# Patient Record
Sex: Female | Born: 1995 | Race: Black or African American | Hispanic: No | Marital: Single | Smoking: Never smoker
Health system: Southern US, Community
[De-identification: ages and names within clinical notes are randomized; demographics above are authoritative.]

## PROBLEM LIST (undated history)

## (undated) DIAGNOSIS — N289 Disorder of kidney and ureter, unspecified: Secondary | ICD-10-CM

## (undated) DIAGNOSIS — I10 Essential (primary) hypertension: Secondary | ICD-10-CM

## (undated) DIAGNOSIS — IMO0002 Reserved for concepts with insufficient information to code with codable children: Secondary | ICD-10-CM

## (undated) DIAGNOSIS — M329 Systemic lupus erythematosus, unspecified: Secondary | ICD-10-CM

---
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
## 2017-05-29 DIAGNOSIS — I10 Essential (primary) hypertension: Secondary | ICD-10-CM | POA: Insufficient documentation

## 2017-05-29 DIAGNOSIS — N1339 Other hydronephrosis: Secondary | ICD-10-CM | POA: Diagnosis not present

## 2017-05-29 DIAGNOSIS — Z79899 Other long term (current) drug therapy: Secondary | ICD-10-CM | POA: Insufficient documentation

## 2017-05-29 DIAGNOSIS — N3000 Acute cystitis without hematuria: Secondary | ICD-10-CM | POA: Insufficient documentation

## 2017-05-29 DIAGNOSIS — R1032 Left lower quadrant pain: Secondary | ICD-10-CM | POA: Diagnosis present

## 2017-05-30 ENCOUNTER — Emergency Department (HOSPITAL_COMMUNITY)
Admission: EM | Admit: 2017-05-30 | Discharge: 2017-05-30 | Disposition: A | Discharge status: Home / Self Care | Payer: Medicaid Other | Attending: Emergency Medicine | Admitting: Emergency Medicine

## 2017-05-30 ENCOUNTER — Emergency Department (HOSPITAL_COMMUNITY): Payer: Medicaid Other

## 2017-05-30 ENCOUNTER — Encounter (HOSPITAL_COMMUNITY): Payer: Self-pay

## 2017-05-30 DIAGNOSIS — R109 Unspecified abdominal pain: Secondary | ICD-10-CM

## 2017-05-30 DIAGNOSIS — N3000 Acute cystitis without hematuria: Secondary | ICD-10-CM

## 2017-05-30 DIAGNOSIS — N1339 Other hydronephrosis: Secondary | ICD-10-CM

## 2017-05-30 HISTORY — DX: Disorder of kidney and ureter, unspecified: N28.9

## 2017-05-30 LAB — CBC WITH DIFFERENTIAL/PLATELET
BASOS PCT: 0 %
Basophils Absolute: 0 10*3/uL (ref 0.0–0.1)
EOS PCT: 0 %
Eosinophils Absolute: 0 10*3/uL (ref 0.0–0.7)
HEMATOCRIT: 40.7 % (ref 36.0–46.0)
Hemoglobin: 13.2 g/dL (ref 12.0–15.0)
Lymphocytes Relative: 19 %
Lymphs Abs: 1.3 10*3/uL (ref 0.7–4.0)
MCH: 24.1 pg — ABNORMAL LOW (ref 26.0–34.0)
MCHC: 32.4 g/dL (ref 30.0–36.0)
MCV: 74.4 fL — AB (ref 78.0–100.0)
MONO ABS: 0.6 10*3/uL (ref 0.1–1.0)
Monocytes Relative: 9 %
NEUTROS ABS: 5 10*3/uL (ref 1.7–7.7)
Neutrophils Relative %: 72 %
Platelets: 435 10*3/uL — ABNORMAL HIGH (ref 150–400)
RBC: 5.47 MIL/uL — ABNORMAL HIGH (ref 3.87–5.11)
RDW: 18.6 % — AB (ref 11.5–15.5)
WBC: 6.9 10*3/uL (ref 4.0–10.5)

## 2017-05-30 LAB — URINALYSIS, ROUTINE W REFLEX MICROSCOPIC
BILIRUBIN URINE: NEGATIVE
Glucose, UA: NEGATIVE mg/dL
HGB URINE DIPSTICK: NEGATIVE
KETONES UR: NEGATIVE mg/dL
NITRITE: NEGATIVE
Specific Gravity, Urine: 1.024 (ref 1.005–1.030)
pH: 7 (ref 5.0–8.0)

## 2017-05-30 LAB — BASIC METABOLIC PANEL
Anion gap: 8 (ref 5–15)
BUN: 9 mg/dL (ref 6–20)
CALCIUM: 8.5 mg/dL — AB (ref 8.9–10.3)
CO2: 23 mmol/L (ref 22–32)
CREATININE: 0.73 mg/dL (ref 0.44–1.00)
Chloride: 106 mmol/L (ref 101–111)
GFR calc non Af Amer: >60 mL/min (ref >60)
GLUCOSE: 128 mg/dL — AB (ref 65–99)
Potassium: 3.5 mmol/L (ref 3.5–5.1)
Sodium: 137 mmol/L (ref 135–145)

## 2017-05-30 LAB — POC URINE PREG, ED: PREG TEST UR: NEGATIVE

## 2017-05-30 MED ORDER — CEPHALEXIN 500 MG PO CAPS: 500.0000 mg | ORAL_CAPSULE | Freq: Three times a day (TID) | ORAL | 0 refills | Status: DC

## 2017-05-30 MED ORDER — DEXTROSE 5 % IV SOLN
1.0000 g | Freq: Once | INTRAVENOUS | Status: AC
  Administered 2017-05-30: 1 g via INTRAVENOUS
  Filled 2017-05-30: qty 10

## 2017-05-30 MED ORDER — SODIUM CHLORIDE 0.9 % IV BOLUS (SEPSIS)
500.0000 mL | Freq: Once | INTRAVENOUS | Status: AC
  Administered 2017-05-30: 500 mL via INTRAVENOUS

## 2017-05-30 MED ORDER — OXYCODONE-ACETAMINOPHEN 5-325 MG PO TABS: 1.0000 | ORAL_TABLET | Freq: Four times a day (QID) | ORAL | 0 refills | Status: DC | PRN

## 2017-05-30 MED ORDER — ONDANSETRON HCL 4 MG/2ML IJ SOLN
4.0000 mg | Freq: Once | INTRAMUSCULAR | Status: AC
  Administered 2017-05-30: 4 mg via INTRAVENOUS
  Filled 2017-05-30: qty 2

## 2017-05-30 MED ORDER — MORPHINE SULFATE (PF) 4 MG/ML IV SOLN
4.0000 mg | Freq: Once | INTRAVENOUS | Status: AC
  Administered 2017-05-30: 4 mg via INTRAVENOUS
  Filled 2017-05-30: qty 1

## 2017-05-30 NOTE — ED Provider Notes (Signed)
[facility]   CSN: [id number] Arrival date & time: 05/29/17  2251     History   Chief Complaint Chief Complaint  Patient presents with  . Flank Pain    Left    HPI Traci Ward is a 22 y.o. female.  HPI   This is a 22 year old female with history of hypertension, kidney disease, lupus who presents with left leg pain.  Onset of symptoms on Thursday.  She reports 7 out of 10 left flank pain that radiates towards her abdomen and umbilicus.  Pain comes and goes.  Does not seem to be worsened by anything.  Denies dysuria or hematuria.  Denies fevers.  Patient is concerned it may be related to her kidneys.  She has not taken anything for her pain.  She is chronically immunosuppressed on Plaquenil.  She does report several episodes of nonbilious, nonbloody emesis.  No diarrhea.  Past Medical History:  Diagnosis Date  . Hypertension   . Kidney disease   . Lupus     There are no active problems to display for this patient.   History reviewed. No pertinent surgical history.  OB History    No data available       Home Medications    Prior to Admission medications   Medication Sig Start Date End Date Taking? Authorizing Provider  amLODipine (NORVASC) 5 MG tablet Take 10 mg by mouth 2 (two) times daily.  01/13/16  Yes [provider]  cetirizine (ZYRTEC) 10 MG tablet Take 10 mg by mouth daily. 12/15/15  Yes [provider]  Cholecalciferol (VITAMIN D) 2000 units CAPS Take 2,000 Units by mouth daily.   Yes [provider]  enalapril (VASOTEC) 5 MG tablet Take 5 mg by mouth daily.   Yes [provider]  hydrochlorothiazide (MICROZIDE) 12.5 MG capsule Take 12.5 mg by mouth 2 (two) times daily.  01/09/16  Yes [provider]  hydroxychloroquine (PLAQUENIL) 200 MG tablet Take 200 mg by mouth 2 (two) times daily.   Yes [provider]  ibuprofen (ADVIL,MOTRIN) 200 MG tablet Take 400 mg  by mouth every 6 (six) hours as needed for headache, mild pain or moderate pain.    Yes [provider]  mycophenolate (CELLCEPT) 250 MG capsule Take 1,000 mg by mouth 2 (two) times daily.  11/04/15  Yes [provider]  ondansetron (ZOFRAN) 8 MG tablet Take 8 mg by mouth every 6 (six) hours as needed for nausea or vomiting.  12/27/15  Yes [provider]  cephALEXin (KEFLEX) 500 MG capsule Take 1 capsule (500 mg total) by mouth 3 (three) times daily. 05/30/17   Horton, Mayer Masker, MD  oxyCODONE-acetaminophen (PERCOCET/ROXICET) 5-325 MG tablet Take 1 tablet by mouth every 6 (six) hours as needed for severe pain. 05/30/17   Horton, Mayer Masker, MD    Family History History reviewed. No pertinent family history.  Social History Social History   Tobacco Use  . Smoking status: Never Smoker  . Smokeless tobacco: Never Used  Substance Use Topics  . Alcohol use: No  . Drug use: No     Allergies   Patient has no known allergies.   Review of Systems Review of Systems  Constitutional: Negative for fever.  Respiratory: Negative for shortness of breath.   Cardiovascular: Negative for chest pain.  Gastrointestinal: Positive for nausea and vomiting. Negative for abdominal pain and diarrhea.  Genitourinary: Positive for flank pain. Negative for dysuria and hematuria.  All  other systems reviewed and are negative.    Physical Exam Updated Vital Signs BP (!) 173/129 (BP Location: Left Arm)   Pulse 80   Temp 98.2 F (36.8 C) (Oral)   Resp 16   SpO2 99%   Physical Exam  Constitutional: She is oriented to person, place, and time. She appears well-developed and well-nourished.  Obese  HENT:  Head: Normocephalic and atraumatic.  Cardiovascular: Normal rate, regular rhythm and normal heart sounds.  Pulmonary/Chest: Effort normal and breath sounds normal. No respiratory distress. She has no wheezes.  Abdominal: Soft. Bowel sounds are normal. She exhibits no mass.  There is tenderness.  Genitourinary:  Genitourinary Comments: Left CVA tenderness  Neurological: She is alert and oriented to person, place, and time.  Skin: Skin is warm and dry.  Psychiatric: She has a normal mood and affect.  Nursing note and vitals reviewed.    ED Treatments / Results  Labs (all labs ordered are listed, but only abnormal results are displayed) Labs Reviewed  URINALYSIS, ROUTINE W REFLEX MICROSCOPIC - Abnormal; Notable for the following components:      Result Value   APPearance HAZY (*)    Protein, ur >=300 (*)    Leukocytes, UA TRACE (*)    Bacteria, UA RARE (*)    Squamous Epithelial / LPF 0-5 (*)    All other components within normal limits  CBC WITH DIFFERENTIAL/PLATELET - Abnormal; Notable for the following components:   RBC 5.47 (*)    MCV 74.4 (*)    MCH 24.1 (*)    RDW 18.6 (*)    Platelets 435 (*)    All other components within normal limits  BASIC METABOLIC PANEL - Abnormal; Notable for the following components:   Glucose, Bld 128 (*)    Calcium 8.5 (*)    All other components within normal limits  URINE CULTURE  POC URINE PREG, ED    EKG  EKG Interpretation None       Radiology Ct Renal Stone Study  Result Date: 05/30/2017 CLINICAL DATA:  Left flank pain for 6 days. No microhematuria. Negative urine pregnancy test. History of hypertension, lupus, and kidney disease. EXAM: CT ABDOMEN AND PELVIS WITHOUT CONTRAST TECHNIQUE: Multidetector CT imaging of the abdomen and pelvis was performed following the standard protocol without IV contrast. COMPARISON:  None. FINDINGS: Lower chest: Lung bases are clear. Hepatobiliary: No focal liver abnormality is seen. No gallstones, gallbladder wall thickening, or biliary dilatation. Pancreas: Unremarkable. No pancreatic ductal dilatation or surrounding inflammatory changes. Spleen: Normal in size without focal abnormality. Adrenals/Urinary Tract: No adrenal gland nodules. Kidneys are symmetrical in size.  Mild left hydronephrosis and hydroureter with stranding around the left kidney. No radiopaque stones are demonstrated. This could represent obstruction due to a stricture or non radiopaque stone or it could be sequela of recently passed stone. Pyelonephritis with reflux would also be a possibility. Bladder wall is not thickened and no bladder stones are demonstrated. Stomach/Bowel: Stomach is within normal limits. Appendix appears normal. No evidence of bowel wall thickening, distention, or inflammatory changes. Vascular/Lymphatic: No significant vascular findings are present. Prominent lymph nodes in the retroperitoneum likely to represent reactive nodes. Reproductive: Uterus and bilateral adnexa are unremarkable. Other: No abdominal wall hernia or abnormality. No abdominopelvic ascites. Musculoskeletal: No acute or significant osseous findings. IMPRESSION: 1. Left hydronephrosis and hydroureter with stranding around the left ureter. No stones identified. This could represent obstruction due to a stricture or non radiopaque stone, sequela of recently passed stone, or  pyelonephritis with reflux. 2. Moderately prominent lymph nodes in the retroperitoneum are likely reactive. Electronically Signed   By: Burman Nieves M.D.   On: 05/30/2017 05:04    Procedures Procedures (including critical care time)  Medications Ordered in ED Medications  cefTRIAXone (ROCEPHIN) 1 g in dextrose 5 % 50 mL IVPB (not administered)  morphine 4 MG/ML injection 4 mg (4 mg Intravenous Given 05/30/17 0505)  ondansetron (ZOFRAN) injection 4 mg (4 mg Intravenous Given 05/30/17 0505)  sodium chloride 0.9 % bolus 500 mL (500 mLs Intravenous New Bag/Given 05/30/17 0505)     Initial Impression / Assessment and Plan / ED Course  I have reviewed the triage vital signs and the nursing notes.  Pertinent labs & imaging results that were available during my care of the patient were reviewed by me and considered in my medical decision  making (see chart for details).     Patient presents with left flank pain.  She is chronically immunosuppressed on Plaquenil.  She is overall nontoxic appearing.  She is hypertensive.  History of the same.  She does have some CVA tenderness.  However she denies any dysuria or fevers.  Lab work is largely reassuring.  No leukocytosis.  Urinalysis does have too numerous to count white cells and rare bacteria.  Urine culture was sent.  CT renal stone study obtained.  There is some left-sided hydronephrosis and stranding which could reflect infection versus recently passed stone versus non-radiopaque stone.  On recheck, patient states that she feels somewhat better.  She was given a dose of IV Rocephin.  Will treat with Keflex for pyelonephritis and provide urology follow-up.  Patient does not appear septic at this time.  She was given strict return precautions.  After history, exam, and medical workup I feel the patient has been appropriately medically screened and is safe for discharge home. Pertinent diagnoses were discussed with the patient. Patient was given return precautions.   Final Clinical Impressions(s) / ED Diagnoses   Final diagnoses:  Left flank pain  Acute cystitis without hematuria  Other hydronephrosis    ED Discharge Orders        Ordered    cephALEXin (KEFLEX) 500 MG capsule  3 times daily     05/30/17 0634    oxyCODONE-acetaminophen (PERCOCET/ROXICET) 5-325 MG tablet  Every 6 hours PRN     05/30/17 0634       Shon Baton, MD 05/30/17 [phone]

## 2017-05-30 NOTE — Discharge Instructions (Signed)
You were seen today for left flank pain.  Your urine may be infected.  Urine culture is pending.  You will be treated for urinary tract infection.  Scan shows evidence of dilation of your left ureter and renal pelvis.  This can reflect a recently passed stone, infection, or a stone that cannot be seen on CT scan.  Take the pain medication as needed.  Follow-up with urology.  If you develop any new or worsening symptoms you should be reevaluated.

## 2017-05-30 NOTE — ED Triage Notes (Signed)
Pt reports 9/10 left sided flank pain since Thursday. Pt denies fevers at home. Pt reports hx of kidney disease and HTN. Pt denies dysuria. Pt denies vaginal d/c. Pt A+OX4, speaking in complete sentences.

## 2017-05-31 LAB — URINE CULTURE

## 2017-08-04 ENCOUNTER — Emergency Department (HOSPITAL_COMMUNITY): Payer: Medicaid Other

## 2017-08-04 ENCOUNTER — Emergency Department (HOSPITAL_COMMUNITY)
Admission: EM | Admit: 2017-08-04 | Discharge: 2017-08-04 | Disposition: A | Discharge status: Home / Self Care | Payer: Medicaid Other | Attending: Physician Assistant | Admitting: Physician Assistant

## 2017-08-04 ENCOUNTER — Other Ambulatory Visit: Payer: Self-pay

## 2017-08-04 DIAGNOSIS — M329 Systemic lupus erythematosus, unspecified: Secondary | ICD-10-CM | POA: Diagnosis not present

## 2017-08-04 DIAGNOSIS — I129 Hypertensive chronic kidney disease with stage 1 through stage 4 chronic kidney disease, or unspecified chronic kidney disease: Secondary | ICD-10-CM | POA: Insufficient documentation

## 2017-08-04 DIAGNOSIS — R112 Nausea with vomiting, unspecified: Secondary | ICD-10-CM | POA: Insufficient documentation

## 2017-08-04 DIAGNOSIS — R109 Unspecified abdominal pain: Secondary | ICD-10-CM | POA: Diagnosis present

## 2017-08-04 DIAGNOSIS — N189 Chronic kidney disease, unspecified: Secondary | ICD-10-CM | POA: Diagnosis not present

## 2017-08-04 DIAGNOSIS — Z8739 Personal history of other diseases of the musculoskeletal system and connective tissue: Secondary | ICD-10-CM | POA: Diagnosis not present

## 2017-08-04 DIAGNOSIS — R000 Tachycardia, unspecified: Secondary | ICD-10-CM | POA: Insufficient documentation

## 2017-08-04 DIAGNOSIS — Z79899 Other long term (current) drug therapy: Secondary | ICD-10-CM | POA: Insufficient documentation

## 2017-08-04 LAB — URINALYSIS, ROUTINE W REFLEX MICROSCOPIC
BILIRUBIN URINE: NEGATIVE
Glucose, UA: NEGATIVE mg/dL
Ketones, ur: 5 mg/dL — AB
Nitrite: NEGATIVE
PH: 7 (ref 5.0–8.0)
Protein, ur: >=300 mg/dL — AB
SPECIFIC GRAVITY, URINE: 1.008 (ref 1.005–1.030)

## 2017-08-04 LAB — CBC
HCT: 43.2 % (ref 36.0–46.0)
HEMOGLOBIN: 14 g/dL (ref 12.0–15.0)
MCH: 24.5 pg — AB (ref 26.0–34.0)
MCHC: 32.4 g/dL (ref 30.0–36.0)
MCV: 75.7 fL — AB (ref 78.0–100.0)
Platelets: 441 10*3/uL — ABNORMAL HIGH (ref 150–400)
RBC: 5.71 MIL/uL — AB (ref 3.87–5.11)
RDW: 17.4 % — ABNORMAL HIGH (ref 11.5–15.5)
WBC: 7.9 10*3/uL (ref 4.0–10.5)

## 2017-08-04 LAB — I-STAT TROPONIN, ED: Troponin i, poc: 0 ng/mL (ref 0.00–0.08)

## 2017-08-04 LAB — BASIC METABOLIC PANEL
Anion gap: 11 (ref 5–15)
BUN: 7 mg/dL (ref 6–20)
CHLORIDE: 105 mmol/L (ref 101–111)
CO2: 21 mmol/L — ABNORMAL LOW (ref 22–32)
Calcium: 8.8 mg/dL — ABNORMAL LOW (ref 8.9–10.3)
Creatinine, Ser: 0.77 mg/dL (ref 0.44–1.00)
GFR calc Af Amer: >60 mL/min (ref >60)
GFR calc non Af Amer: >60 mL/min (ref >60)
GLUCOSE: 131 mg/dL — AB (ref 65–99)
POTASSIUM: 4 mmol/L (ref 3.5–5.1)
Sodium: 137 mmol/L (ref 135–145)

## 2017-08-04 LAB — PREGNANCY, URINE: Preg Test, Ur: NEGATIVE

## 2017-08-04 MED ORDER — CEPHALEXIN 500 MG PO CAPS: 500.0000 mg | ORAL_CAPSULE | Freq: Four times a day (QID) | ORAL | 0 refills | Status: DC

## 2017-08-04 MED ORDER — KETOROLAC TROMETHAMINE 30 MG/ML IJ SOLN
30.0000 mg | Freq: Once | INTRAMUSCULAR | Status: AC
  Administered 2017-08-04: 30 mg via INTRAVENOUS
  Filled 2017-08-04: qty 1

## 2017-08-04 MED ORDER — SODIUM CHLORIDE 0.9 % IV SOLN
1.0000 g | Freq: Once | INTRAVENOUS | Status: AC
  Administered 2017-08-04: 1 g via INTRAVENOUS
  Filled 2017-08-04: qty 10

## 2017-08-04 MED ORDER — ONDANSETRON HCL 4 MG/2ML IJ SOLN
4.0000 mg | Freq: Once | INTRAMUSCULAR | Status: AC
  Administered 2017-08-04: 4 mg via INTRAVENOUS
  Filled 2017-08-04: qty 2

## 2017-08-04 MED ORDER — SODIUM CHLORIDE 0.9 % IV BOLUS
1000.0000 mL | Freq: Once | INTRAVENOUS | Status: AC
  Administered 2017-08-04: 1000 mL via INTRAVENOUS

## 2017-08-04 MED ORDER — OXYCODONE-ACETAMINOPHEN 5-325 MG PO TABS: 1.0000 | ORAL_TABLET | Freq: Four times a day (QID) | ORAL | 0 refills | Status: DC | PRN

## 2017-08-04 MED ORDER — ONDANSETRON 4 MG PO TBDP: 4.0000 mg | ORAL_TABLET | Freq: Three times a day (TID) | ORAL | 0 refills | Status: DC | PRN

## 2017-08-04 NOTE — ED Provider Notes (Signed)
[facility]-EMERGENCY DEPT Provider Note   CSN: [id number] Arrival date & time: 08/04/17  [id number]     History   Chief Complaint Chief Complaint  Patient presents with  . Flank Pain    Left    HPI Traci Ward is a 22 y.o. female.  HPI   22 year old female with past medical history significant for hypertension and kidney disease and lupus.  Presenting today with left-sided CVA pain.  Patient reports going on since last Sunday.  It is intermittent and yet present all the time.  This worsens and gets better.  Patient reports she has a lot of urgency but no burning with urination.  Patient reports his has one time previously and they told her "you might have kidney stones".  Patient never had any surgeries previously.  No fevers at home.  She does report nausea and vomiting.  Past Medical History:  Diagnosis Date  . Hypertension   . Kidney disease   . Lupus     There are no active problems to display for this patient.   No past surgical history on file.   OB History   None      Home Medications    Prior to Admission medications   Medication Sig Start Date End Date Taking? Authorizing Provider  amLODipine (NORVASC) 5 MG tablet Take 10 mg by mouth 2 (two) times daily.  01/13/16   [provider]  cephALEXin (KEFLEX) 500 MG capsule Take 1 capsule (500 mg total) by mouth 3 (three) times daily. 05/30/17   Horton, Mayer Maskerourtney F, MD  cetirizine (ZYRTEC) 10 MG tablet Take 10 mg by mouth daily. 12/15/15   [provider]  Cholecalciferol (VITAMIN D) 2000 units CAPS Take 2,000 Units by mouth daily.    [provider]  enalapril (VASOTEC) 5 MG tablet Take 5 mg by mouth daily.    [provider]  hydrochlorothiazide (MICROZIDE) 12.5 MG capsule Take 12.5 mg by mouth 2 (two) times daily.  01/09/16   [provider]  hydroxychloroquine (PLAQUENIL) 200 MG tablet Take 200 mg by mouth 2 (two) times daily.    [provider]  ibuprofen (ADVIL,MOTRIN) 200 MG tablet Take 400 mg by mouth every 6 (six) hours as needed for headache, mild pain or moderate pain.     [provider]  mycophenolate (CELLCEPT) 250 MG capsule Take 1,000 mg by mouth 2 (two) times daily.  11/04/15   [provider]  ondansetron (ZOFRAN) 8 MG tablet Take 8 mg by mouth every 6 (six) hours as needed for nausea or vomiting.  12/27/15   [provider]  oxyCODONE-acetaminophen (PERCOCET/ROXICET) 5-325 MG tablet Take 1 tablet by mouth every 6 (six) hours as needed for severe pain. 05/30/17   Horton, Mayer Maskerourtney F, MD    Family History No family history on file.  Social History Social History   Tobacco Use  . Smoking status: Never Smoker  . Smokeless tobacco: Never Used  Substance Use Topics  . Alcohol use: No  . Drug use: No     Allergies   Patient has no known allergies.   Review of Systems Review of Systems  Constitutional: Negative for activity change, fatigue and fever.  Respiratory: Negative for shortness of breath.   Cardiovascular: Negative for chest pain.  Gastrointestinal: Positive for abdominal pain.  Genitourinary: Positive for flank pain and urgency. Negative for dysuria.  All other systems reviewed and are negative.    Physical Exam Updated Vital Signs Pulse Marland Kitchen(!)  118   Temp 98.3 F (36.8 C) (Oral)   Resp 16   SpO2 97%   Physical Exam  Constitutional: She is oriented to person, place, and time. She appears well-developed and well-nourished.  HENT:  Head: Normocephalic and atraumatic.  Eyes: Right eye exhibits no discharge. Left eye exhibits no discharge.  Cardiovascular: Regular rhythm and normal heart sounds.  No murmur heard. tachycardia  Pulmonary/Chest: Effort normal and breath sounds normal. She has no wheezes. She has no rales.  Abdominal: Soft. She exhibits no distension. There is no tenderness.  Musculoskeletal:  CVA tenderness on the left.  Neurological: She is oriented to  person, place, and time.  Skin: Skin is warm and dry. She is not diaphoretic.  Psychiatric: She has a normal mood and affect.  Nursing note and vitals reviewed.    ED Treatments / Results  Labs (all labs ordered are listed, but only abnormal results are displayed) Labs Reviewed  URINALYSIS, ROUTINE W REFLEX MICROSCOPIC - Abnormal; Notable for the following components:      Result Value   Hgb urine dipstick SMALL (*)    Ketones, ur 5 (*)    Protein, ur >=300 (*)    Leukocytes, UA MODERATE (*)    Bacteria, UA RARE (*)    Squamous Epithelial / LPF 0-5 (*)    All other components within normal limits  CBC - Abnormal; Notable for the following components:   RBC 5.71 (*)    MCV 75.7 (*)    MCH 24.5 (*)    RDW 17.4 (*)    Platelets 441 (*)    All other components within normal limits  URINE CULTURE  BASIC METABOLIC PANEL  I-STAT BETA HCG BLOOD, ED ([facility], [facility], [facility] ONLY)  I-STAT TROPONIN, ED    EKG None  Radiology No results found.  Procedures Procedures (including critical care time)  Medications Ordered in ED Medications - No data to display   Initial Impression / Assessment and Plan / ED Course  I have reviewed the triage vital signs and the nursing notes.  Pertinent labs & imaging results that were available during my care of the patient were reviewed by me and considered in my medical decision making (see chart for details).     22 year old female with past medical history significant for hypertension and kidney disease and lupus.  Presenting today with left-sided CVA pain.  Patient reports going on since last Sunday.  It is intermittent and yet present all the time.  This worsens and gets better.  Patient reports she has a lot of urgency but no burning with urination.  Patient reports his has one time previously and they told her "you might have kidney stones".  Patient never had any surgeries previously.  No fevers at home.  She does report nausea and  vomiting.  10:17 AM Pyelonephritis versus stone disease.  Urine appears infected.  Will treat with ceftriaxone, fluids, pain control and nausea control.  Awaiting CT stone.  3:25 PM CT shows pyelonephritis.  Patient has history of lupus and has a nephrology appointment in 9 days.  CT is unchanged from January.  Discussed with urology since his CAT scans remained unchanged.  Will have her follow-up with them too.  I called mom's patient's mom and discussed with her as well.  With normal vital signs, reassuring labs and patient looking as good as good as she does, I think outpatient treatment will be a reasonable first option.  Gave patient option to admit or  discharge.  Patient would like to try discharge at this point.  Patient eating drinking and amatory at time of discharge.  Final Clinical Impressions(s) / ED Diagnoses   Final diagnoses:  None    ED Discharge Orders    None       Alorah Mcree, Cindee Salt, MD 08/04/17 1527

## 2017-08-04 NOTE — ED Triage Notes (Signed)
Pt reports left sided flank pain w/ radiation x1 week. Pt also report n/v. Pt A+OX4, NAD.

## 2017-08-04 NOTE — Discharge Instructions (Signed)
You are found to have an infection in your urine.  Likely moved up into your kidney.  You had this one time previously.  It is not normal for you to have the same thing in such a short period time.  Therefore we want you to call the specialist, urologist, to follow-up.  We talked about whether you would come in the hospital to be admitted or be discharged.  You wanted to try to start treatment at home.  However you are not improving please come immediately to the emergency department.

## 2017-08-05 LAB — URINE CULTURE
Culture: <10000 — AB
SPECIAL REQUESTS: NORMAL

## 2017-12-11 ENCOUNTER — Emergency Department (HOSPITAL_COMMUNITY): Payer: Medicaid Other

## 2017-12-11 ENCOUNTER — Encounter (HOSPITAL_COMMUNITY): Payer: Self-pay

## 2017-12-11 ENCOUNTER — Other Ambulatory Visit: Payer: Self-pay

## 2017-12-11 ENCOUNTER — Emergency Department (HOSPITAL_COMMUNITY)
Admission: EM | Admit: 2017-12-11 | Discharge: 2017-12-11 | Disposition: A | Discharge status: Home / Self Care | Payer: Medicaid Other | Attending: Emergency Medicine | Admitting: Emergency Medicine

## 2017-12-11 DIAGNOSIS — I129 Hypertensive chronic kidney disease with stage 1 through stage 4 chronic kidney disease, or unspecified chronic kidney disease: Secondary | ICD-10-CM | POA: Diagnosis not present

## 2017-12-11 DIAGNOSIS — N189 Chronic kidney disease, unspecified: Secondary | ICD-10-CM | POA: Diagnosis not present

## 2017-12-11 DIAGNOSIS — R1084 Generalized abdominal pain: Secondary | ICD-10-CM

## 2017-12-11 DIAGNOSIS — Z79899 Other long term (current) drug therapy: Secondary | ICD-10-CM | POA: Diagnosis not present

## 2017-12-11 DIAGNOSIS — R112 Nausea with vomiting, unspecified: Secondary | ICD-10-CM | POA: Diagnosis not present

## 2017-12-11 DIAGNOSIS — R109 Unspecified abdominal pain: Secondary | ICD-10-CM | POA: Insufficient documentation

## 2017-12-11 LAB — COMPREHENSIVE METABOLIC PANEL
ALBUMIN: 2.7 g/dL — AB (ref 3.5–5.0)
ALT: 9 U/L (ref 0–44)
ANION GAP: 12 (ref 5–15)
AST: 12 U/L — ABNORMAL LOW (ref 15–41)
Alkaline Phosphatase: 130 U/L — ABNORMAL HIGH (ref 38–126)
BILIRUBIN TOTAL: 0.7 mg/dL (ref 0.3–1.2)
BUN: 10 mg/dL (ref 6–20)
CO2: 20 mmol/L — ABNORMAL LOW (ref 22–32)
Calcium: 8.5 mg/dL — ABNORMAL LOW (ref 8.9–10.3)
Chloride: 104 mmol/L (ref 98–111)
Creatinine, Ser: 1.01 mg/dL — ABNORMAL HIGH (ref 0.44–1.00)
GFR calc Af Amer: >60 mL/min (ref >60)
GLUCOSE: 146 mg/dL — AB (ref 70–99)
POTASSIUM: 3.4 mmol/L — AB (ref 3.5–5.1)
Sodium: 136 mmol/L (ref 135–145)
TOTAL PROTEIN: 7.6 g/dL (ref 6.5–8.1)

## 2017-12-11 LAB — URINALYSIS, ROUTINE W REFLEX MICROSCOPIC
Bilirubin Urine: NEGATIVE
Glucose, UA: NEGATIVE mg/dL
Ketones, ur: NEGATIVE mg/dL
Leukocytes, UA: NEGATIVE
NITRITE: POSITIVE — AB
Protein, ur: >=300 mg/dL — AB
Specific Gravity, Urine: >1.046 — ABNORMAL HIGH (ref 1.005–1.030)
pH: 6 (ref 5.0–8.0)

## 2017-12-11 LAB — CBC
HEMATOCRIT: 48.1 % — AB (ref 36.0–46.0)
HEMOGLOBIN: 14.3 g/dL (ref 12.0–15.0)
MCH: 22.3 pg — ABNORMAL LOW (ref 26.0–34.0)
MCHC: 29.7 g/dL — ABNORMAL LOW (ref 30.0–36.0)
MCV: 75.2 fL — ABNORMAL LOW (ref 78.0–100.0)
Platelets: 533 10*3/uL — ABNORMAL HIGH (ref 150–400)
RBC: 6.4 MIL/uL — ABNORMAL HIGH (ref 3.87–5.11)
RDW: 19.9 % — ABNORMAL HIGH (ref 11.5–15.5)
WBC: 11.1 10*3/uL — AB (ref 4.0–10.5)

## 2017-12-11 LAB — LIPASE, BLOOD: LIPASE: 32 U/L (ref 11–51)

## 2017-12-11 LAB — I-STAT BETA HCG BLOOD, ED (MC, WL, AP ONLY)

## 2017-12-11 LAB — POC OCCULT BLOOD, ED: FECAL OCCULT BLD: POSITIVE — AB

## 2017-12-11 MED ORDER — OMEPRAZOLE 40 MG PO CPDR: 40.0000 mg | DELAYED_RELEASE_CAPSULE | Freq: Every day | ORAL | 0 refills | Status: DC

## 2017-12-11 MED ORDER — ONDANSETRON 4 MG PO TBDP
4.0000 mg | ORAL_TABLET | Freq: Once | ORAL | Status: AC | PRN
  Administered 2017-12-11: 4 mg via ORAL
  Filled 2017-12-11: qty 1

## 2017-12-11 MED ORDER — DICYCLOMINE HCL 20 MG PO TABS: 20.0000 mg | ORAL_TABLET | Freq: Three times a day (TID) | ORAL | 0 refills | Status: DC | PRN

## 2017-12-11 MED ORDER — AMLODIPINE BESYLATE 5 MG PO TABS
5.0000 mg | ORAL_TABLET | Freq: Once | ORAL | Status: AC
  Administered 2017-12-11: 5 mg via ORAL
  Filled 2017-12-11: qty 1

## 2017-12-11 MED ORDER — MORPHINE SULFATE (PF) 4 MG/ML IV SOLN
4.0000 mg | Freq: Once | INTRAVENOUS | Status: AC
  Administered 2017-12-11: 4 mg via INTRAVENOUS
  Filled 2017-12-11: qty 1

## 2017-12-11 MED ORDER — MORPHINE SULFATE (PF) 4 MG/ML IV SOLN
4.0000 mg | Freq: Once | INTRAVENOUS | Status: AC
Start: 2017-12-11 — End: 2017-12-11
  Administered 2017-12-11: 4 mg via INTRAVENOUS
  Filled 2017-12-11: qty 1

## 2017-12-11 MED ORDER — IOHEXOL 300 MG/ML  SOLN
100.0000 mL | Freq: Once | INTRAMUSCULAR | Status: AC | PRN
  Administered 2017-12-11: 100 mL via INTRAVENOUS

## 2017-12-11 MED ORDER — DICYCLOMINE HCL 10 MG/ML IM SOLN
20.0000 mg | Freq: Once | INTRAMUSCULAR | Status: AC
  Administered 2017-12-11: 20 mg via INTRAMUSCULAR
  Filled 2017-12-11: qty 2

## 2017-12-11 MED ORDER — ONDANSETRON HCL 4 MG/2ML IJ SOLN
4.0000 mg | Freq: Once | INTRAMUSCULAR | Status: AC
  Administered 2017-12-11: 4 mg via INTRAVENOUS
  Filled 2017-12-11: qty 2

## 2017-12-11 MED ORDER — HYDROCHLOROTHIAZIDE 25 MG PO TABS
12.5000 mg | ORAL_TABLET | Freq: Once | ORAL | Status: AC
  Administered 2017-12-11: 12.5 mg via ORAL
  Filled 2017-12-11: qty 1

## 2017-12-11 MED ORDER — ONDANSETRON 4 MG PO TBDP: 4.0000 mg | ORAL_TABLET | Freq: Three times a day (TID) | ORAL | 0 refills | Status: DC | PRN

## 2017-12-11 MED ORDER — SODIUM CHLORIDE 0.9 % IV BOLUS
1000.0000 mL | Freq: Once | INTRAVENOUS | Status: AC
  Administered 2017-12-11: 1000 mL via INTRAVENOUS

## 2017-12-11 MED ORDER — GI COCKTAIL ~~LOC~~
30.0000 mL | Freq: Once | ORAL | Status: AC
  Administered 2017-12-11: 30 mL via ORAL
  Filled 2017-12-11: qty 30

## 2017-12-11 MED ORDER — FAMOTIDINE IN NACL 20-0.9 MG/50ML-% IV SOLN
20.0000 mg | Freq: Two times a day (BID) | INTRAVENOUS | Status: DC
  Administered 2017-12-11: 20 mg via INTRAVENOUS
  Filled 2017-12-11: qty 50

## 2017-12-11 MED ORDER — ENALAPRIL MALEATE 5 MG PO TABS
5.0000 mg | ORAL_TABLET | Freq: Once | ORAL | Status: AC
  Administered 2017-12-11: 5 mg via ORAL
  Filled 2017-12-11: qty 1

## 2017-12-11 MED ORDER — MORPHINE SULFATE (PF) 2 MG/ML IV SOLN: 2.0000 mg | Freq: Once | INTRAVENOUS | Status: DC

## 2017-12-11 NOTE — ED Triage Notes (Signed)
Pt reports hx of lupus. She states she woke up this morning with abdominal pain and vomiting. She states she had an episode of blood in her stool yesterday. She reports feeling fatigued as well as headache.

## 2017-12-11 NOTE — ED Notes (Signed)
Pt stable, ambulatory, and verbalizes understanding of d/c instructions.  

## 2017-12-11 NOTE — ED Notes (Signed)
Patient transported to X-ray 

## 2017-12-11 NOTE — ED Provider Notes (Addendum)
MSE was initiated and I personally evaluated the patient and placed orders (if any) at  2:03 PM on December 11, 2017.  The patient appears stable so that the remainder of the MSE may be completed by another provider.  Patient placed in Quick Look pathway, seen and evaluated   Chief Complaint: Abdominal pain, bloody stool  HPI:   Patient is a 22 year old female with a history of lupus, on hydroxychloroquine, hypertension, and CKD presenting for generalized abdominal pain.  Patient reports that she began having generalized cramping that woke up around 3 AM followed by greater than 10 times episodes of emesis.  Patient reports she has had emesis mixed with blood earlier today, as well as one episode of a soft, bloody stool, patient reports that blood was mixed in.  Denies fevers or chills.  Denies recent sick contacts or travel.  No abdominal surgical history.  ROS: See HPI (one)  Physical Exam:   Gen: Appears uncomfortable.   Neuro: Awake and Alert  Skin: Warm    Focused Exam: Bowel sounds present all 4 quadrants and epigastrium.  Abdomen tympanitic, but tender to percussion.  Generalized abdominal tenderness, particularly in the epigastrium, but no focal right lower and left lower quadrant tenderness to palpation. No guarding or rebound.    Initiation of care has begun. The patient has been counseled on the process, plan, and necessity for staying for the completion/evaluation, and the remainder of the medical screening examination  Patient verbally verified a safe ride from the ED. Proceeded with prescribing morphine for pain/relaxtion/muscle relaxation in the ED.      Elisha PonderMurray, Alyssa B, PA-C 12/11/17 1516    Terrilee FilesButler, Michael C, MD 12/11/17 1745

## 2017-12-11 NOTE — ED Provider Notes (Signed)
[facility] EMERGENCY DEPARTMENT Provider Note   CSN: [id number] Arrival date & time: 12/11/17  1306     History   Chief Complaint Chief Complaint  Patient presents with  . Emesis    HPI Traci Ward is a 22 y.o. female with a hx of Lupus on plaquenil and cellcept, HTN, and CKD who presents to the ED with complaints of abdominal pain that started at 0300 this AM. Patient states that she woke from sleep with nausea and emesis. States she subsequently developed crampy diffuse abdominal pain. Pain has continued to be diffuse but is worse in the epigastric area overall. She states current discomfort is a 10/10 in severity, it was temporarily relieved with morphine in triage. She has had > 10 episodes of emesis which recently have been blood streaked, no coffee ground emesis, now mostly driving heaving. She did vomit immediately after zofran in triage. She reports 1 episode of loose stool, not necessarily diarrhea last night, there was bright red blood mixed into the stool last night. No BMs today thus far. She did just start her menstrual cycle. Denies fever, chills, dysuria, frequency, or vaginal discharge. Patient was unable to take her medications this AM due to nausea and vomiting including anti-hypertensive medicines. Denies recent foreign travel, abx, or hospitalizations.     HPI  Past Medical History:  Diagnosis Date  . Hypertension   . Kidney disease   . Lupus (HCC)     There are no active problems to display for this patient.   History reviewed. No pertinent surgical history.   OB History   None      Home Medications    Prior to Admission medications   Medication Sig Start Date End Date Taking? Authorizing Provider  amLODipine (NORVASC) 5 MG tablet Take 10 mg by mouth 2 (two) times daily.  01/13/16   [provider]  cephALEXin (KEFLEX) 500 MG capsule Take 1 capsule (500 mg total) by mouth 3 (three) times daily. Patient not taking:  Reported on 08/04/2017 05/30/17   Horton, Mayer Masker, MD  cephALEXin (KEFLEX) 500 MG capsule Take 1 capsule (500 mg total) by mouth 4 (four) times daily. 08/04/17   Mackuen, Courteney Lyn, MD  cetirizine (ZYRTEC) 10 MG tablet Take 10 mg by mouth daily. 12/15/15   [provider]  Cholecalciferol (VITAMIN D) 2000 units CAPS Take 2,000 Units by mouth daily.    [provider]  enalapril (VASOTEC) 5 MG tablet Take 5 mg by mouth daily.    [provider]  hydrochlorothiazide (MICROZIDE) 12.5 MG capsule Take 12.5 mg by mouth 2 (two) times daily.  01/09/16   [provider]  hydroxychloroquine (PLAQUENIL) 200 MG tablet Take 200 mg by mouth 2 (two) times daily.    [provider]  ibuprofen (ADVIL,MOTRIN) 200 MG tablet Take 400 mg by mouth every 6 (six) hours as needed for headache, mild pain or moderate pain.     [provider]  mycophenolate (CELLCEPT) 500 MG tablet Take 1,000 mg by mouth every 12 (twelve) hours. 06/20/17   [provider]  NORTREL 7/7/7 0.5/0.75/1-35 MG-MCG tablet Take 1 tablet by mouth daily. 07/28/17   [provider]  ondansetron (ZOFRAN ODT) 4 MG disintegrating tablet Take 1 tablet (4 mg total) by mouth every 8 (eight) hours as needed for nausea or vomiting. 08/04/17   Mackuen, Courteney Lyn, MD  oxyCODONE-acetaminophen (PERCOCET/ROXICET) 5-325 MG tablet Take 1 tablet by mouth every 6 (six) hours as needed for  severe pain. Patient not taking: Reported on 08/04/2017 05/30/17   Horton, Mayer Maskerourtney F, MD  oxyCODONE-acetaminophen (PERCOCET/ROXICET) 5-325 MG tablet Take 1 tablet by mouth every 6 (six) hours as needed for severe pain. 08/04/17   Mackuen, Courteney Lyn, MD  ranitidine (ZANTAC) 150 MG capsule Take 150 mg by mouth 2 (two) times daily. 06/23/17   [provider]    Family History History reviewed. No pertinent family history.  Social History Social History   Tobacco Use  . Smoking status: Never Smoker  .  Smokeless tobacco: Never Used  Substance Use Topics  . Alcohol use: No  . Drug use: No     Allergies   Patient has no known allergies.   Review of Systems Review of Systems  Constitutional: Negative for fever.  Respiratory: Negative for shortness of breath.   Cardiovascular: Negative for chest pain.  Gastrointestinal: Positive for abdominal pain, blood in stool, diarrhea (loose stool, not necessarily diarrhea), nausea and vomiting. Negative for constipation.  Genitourinary: Positive for vaginal bleeding (currently menstruating). Negative for dysuria, frequency, urgency and vaginal discharge.  All other systems reviewed and are negative.   Physical Exam Updated Vital Signs BP (!) 200/159 (BP Location: Right Arm)   Pulse (!) 107   Temp 98.3 F (36.8 C) (Oral)   Resp 18   SpO2 100%   Physical Exam  Constitutional: She appears well-developed and well-nourished.  Non-toxic appearance. No distress.  HENT:  Head: Normocephalic and atraumatic.  Mucous membranes are dry.  Eyes: Conjunctivae are normal. Right eye exhibits no discharge. Left eye exhibits no discharge.  Neck: Neck supple.  Cardiovascular: Normal rate and regular rhythm.  Pulmonary/Chest: Effort normal and breath sounds normal. No respiratory distress. She has no wheezes. She has no rhonchi. She has no rales.  Respiration even and unlabored  Abdominal: She exhibits no distension. There is generalized tenderness (Generalized tenderness with increased tenderness in the epigastrium). There is no rigidity, no rebound, no guarding, no CVA tenderness, no tenderness at McBurney's point and negative Murphy's sign.  Genitourinary: Rectal exam shows guaiac positive stool. Rectal exam shows no external hemorrhoid. There is bleeding in the vagina.  Genitourinary Comments: DRE notable for soft brown stool, no melena or bright red blood per rectum.  Patient does have notable vaginal bleeding.  EDT present as chaperone.  Neurological:  She is alert.  Clear speech.   Skin: Skin is warm and dry. No rash noted.  Psychiatric: She has a normal mood and affect. Her behavior is normal.  Nursing note and vitals reviewed.   ED Treatments / Results  Labs (all labs ordered are listed, but only abnormal results are displayed) Labs Reviewed  COMPREHENSIVE METABOLIC PANEL - Abnormal; Notable for the following components:      Result Value   Potassium 3.4 (*)    CO2 20 (*)    Glucose, Bld 146 (*)    Creatinine, Ser 1.01 (*)    Calcium 8.5 (*)    Albumin 2.7 (*)    AST 12 (*)    Alkaline Phosphatase 130 (*)    All other components within normal limits  CBC - Abnormal; Notable for the following components:   WBC 11.1 (*)    RBC 6.40 (*)    HCT 48.1 (*)    MCV 75.2 (*)    MCH 22.3 (*)    MCHC 29.7 (*)    RDW 19.9 (*)    Platelets 533 (*)    All other components within normal limits  URINALYSIS, ROUTINE W REFLEX MICROSCOPIC - Abnormal; Notable for the following components:   APPearance HAZY (*)    Specific Gravity, Urine >1.046 (*)    Hgb urine dipstick SMALL (*)    Protein, ur >=300 (*)    Nitrite POSITIVE (*)    Bacteria, UA RARE (*)    All other components within normal limits  POC OCCULT BLOOD, ED - Abnormal; Notable for the following components:   Fecal Occult Bld POSITIVE (*)    All other components within normal limits  URINE CULTURE  LIPASE, BLOOD  I-STAT BETA HCG BLOOD, ED ([facility], [facility], [facility] ONLY)    EKG None  Radiology Dg Chest 2 View  Result Date: 12/11/2017 CLINICAL DATA:  Epigastric pain EXAM: CHEST - 2 VIEW COMPARISON:  None. FINDINGS: The heart size and mediastinal contours are within normal limits. Both lungs are clear. The visualized skeletal structures are unremarkable. IMPRESSION: No active cardiopulmonary disease. Electronically Signed   By: Marlan Palau M.D.   On: 12/11/2017 15:54   Ct Abdomen Pelvis W Contrast  Result Date: 12/11/2017 CLINICAL DATA:  Abdominal pain and vomiting.  History of  lupus. EXAM: CT ABDOMEN AND PELVIS WITH CONTRAST TECHNIQUE: Multidetector CT imaging of the abdomen and pelvis was performed using the standard protocol following bolus administration of intravenous contrast. CONTRAST:  OMNIPAQUE IOHEXOL 300 MG/ML  SOLN COMPARISON:  08/04/2017 FINDINGS: Lower chest: Unremarkable. Hepatobiliary: No focal abnormality within the liver parenchyma. There is no evidence for gallstones, gallbladder wall thickening, or pericholecystic fluid. No intrahepatic or extrahepatic biliary dilation. Pancreas: No focal mass lesion. No dilatation of the main duct. No intraparenchymal cyst. No peripancreatic edema. Spleen: No splenomegaly. No focal mass lesion. Adrenals/Urinary Tract: No adrenal nodule or mass. Kidneys unremarkable. No evidence for hydroureter. The urinary bladder appears normal for the degree of distention. Stomach/Bowel: Diffuse edema and wall thickening noted in the mid and distal gastric wall. Duodenum is normally positioned as is the ligament of Treitz. No small bowel wall thickening. No small bowel dilatation. The terminal ileum is normal. The appendix is normal. No gross colonic mass. No colonic wall thickening. No substantial diverticular change. Vascular/Lymphatic: No abdominal aortic aneurysm. No abdominal aortic atherosclerotic calcification. 16 mm short axis hepato duodenal ligament lymph node is similar to prior. Scattered small para-aortic lymph nodes are not substantially changed. 16 mm right internal iliac lymph node (3:54) is stable. 11 mm short axis left external iliac lymph node is unchanged and a 14 mm short axis distal left external iliac node (3:74) is also stable. Similar mild lymphadenopathy noted in right pelvic sidewall. Reproductive: The uterus has normal CT imaging appearance. There is no adnexal mass. Other: Small volume intraperitoneal free fluid. Musculoskeletal: No worrisome lytic or sclerotic osseous abnormality. IMPRESSION: 1. Edema and wall  thickening noted diffusely in the mid and distal stomach. Gastritis is a primary consideration. 2. Similar appearance of borderline and mild lymphadenopathy in the hepato duodenal ligament, retroperitoneal abdomen, and both pelvic sidewalls. 3. Trace intraperitoneal free fluid. Electronically Signed   By: Kennith Center M.D.   On: 12/11/2017 16:24    Procedures Procedures (including critical care time)  Medications Ordered in ED Medications  famotidine (PEPCID) IVPB 20 mg premix (has no administration in time range)  ondansetron (ZOFRAN-ODT) disintegrating tablet 4 mg (4 mg Oral Given 12/11/17 1348)  morphine 4 MG/ML injection 4 mg (4 mg Intravenous Given 12/11/17 1443)     Initial Impression / Assessment and Plan / ED Course  I have reviewed  the triage vital signs and the nursing notes.  Pertinent labs & imaging results that were available during my care of the patient were reviewed by me and considered in my medical decision making (see chart for details).  15:38: Attempted to evaluate patient- she is in transport to radiology for imaging.   Patient is a 22 year old female with a history of lupus, hypertension, and CKD who presents to the emergency department with nausea, vomiting, abdominal pain.  Patient nontoxic-appearing, notably hypertensive and tachycardic upon arrival, I suspect this is secondary to dehydration as well as inability to take her antihypertensive medications today, do not suspect HTN emergency.  On exam patient is diffusely tender to palpation with some increased tenderness in the epigastrium area.   Work-up initiated by provider in triage has been reviewed: Patient has nonspecific leukocytosis at 11.1.  Hemoglobin stable at 14.3.  She is mildly hypokalemic at 3.4.  Her creatinine is somewhat increased from prior at 1.01, previously runs around 0.7, again suspect this is secondary to dehydration.  Urinalysis is consistent with dehydration as well, she is nitrite positive  with rare bacteria, leukocyte negative, patient is not having any urinary symptoms, will culture, but low suspicion for UTI at this time.  Fecal occult blood is positive, however she is having vaginal bleeding with menstruation, there was no gross blood on digital rectal exam or tarry stools, BUN and hgb WNL which is reassuring.  Chest x-ray negative for acute active cardiopulmonary disease.  CT abdomen pelvis with contrast feels findings consistent with likely gastritis, there are other findings of lymphadenopathy which have been present on previous imaging.  Patient feeling improved and tolerating PO following foods, analgesics, and antiemetics in the emergency department.  On repeat exam patient remains without peritoneal signs, do not suspect acute surgical abdomen, doubt appendicitis, pancreatitis, cholecystitis, bowel perforation/obstruction, PID, or ectopic pregnancy.  Her vitals are improving with fluids- HR normalized, BP improved, likely will continue to improve after BP meds given here. She has not had any recent foreign travel or antibiotics or hospitalizations to raise concern for any specific GI infections.  At this time suspect viral gastritis.  Will treat supportively with gastroenterology follow-up. I discussed results, treatment plan, need for follow-up, and return precautions with the patient. Provided opportunity for questions, patient confirmed understanding and is in agreement with plan.   Findings and plan of care discussed with supervising physician Dr. Anitra LauthPlunkett- in agreement with plan.   Final Clinical Impressions(s) / ED Diagnoses   Final diagnoses:  Generalized abdominal pain  Nausea and vomiting, intractability of vomiting not specified, unspecified vomiting type    ED Discharge Orders         Ordered    ondansetron (ZOFRAN ODT) 4 MG disintegrating tablet  Every 8 hours PRN     12/11/17 1839    omeprazole (PRILOSEC) 40 MG capsule  Daily     12/11/17 1839    dicyclomine  (BENTYL) 20 MG tablet  Every 8 hours PRN     12/11/17 1839           Cherly Andersonetrucelli, Dalanie Kisner R, PA-C 12/11/17 2047    Gwyneth SproutPlunkett, Whitney, MD 12/11/17 2308

## 2017-12-11 NOTE — ED Notes (Signed)
Called lab to add on urine culture. Pt discharged

## 2017-12-11 NOTE — Discharge Instructions (Signed)
You were seen in the emergency department today for nausea, vomiting, and abdominal pain.  Your lab work appeared consistent with being dehydrated.  You were given fluids in the ER.  Your chest x-ray was normal.  Your CT of the abdomen showed findings to assist with gastritis as well as some lymph nodes which have been seen previously.   At this time we suspect this is a viral GI illness.  We are starting you on multiple medications including Zofran, omeprazole, and Bentyl. -Zofran-this is an antinausea medication you may place under your tongue and have dissolve every 8 hours as needed for nausea and vomiting. -Omeprazole-this is an antiacid medication, take this daily each morning. -Bentyl-this is an anti-cramping/antispasm medicine you may take every 8 hours as needed for abdominal cramping.  We have prescribed you new medication(s) today. Discuss the medications prescribed today with your pharmacist as they can have adverse effects and interactions with your other medicines including over the counter and prescribed medications. Seek medical evaluation if you start to experience new or abnormal symptoms after taking one of these medicines, seek care immediately if you start to experience difficulty breathing, feeling of your throat closing, facial swelling, or rash as these could be indications of a more serious allergic reaction  Continue to take your at home prescribed medications.  We would like you to follow-up with a GI doctor within the next 1 to 3 days.  You may see your own GI doctor or follow-up with a GI doctor providing her discharge instructions.  Return to the ER anytime for new or worsening symptoms including but not limited to inability to keep fluids down, worsening pain, fever, blood in the stool, dark or tarry stool, or any other concerns that you may have.

## 2017-12-13 LAB — URINE CULTURE

## 2018-05-01 ENCOUNTER — Telehealth: Payer: Self-pay | Admitting: *Deleted

## 2018-05-01 NOTE — Telephone Encounter (Signed)
REFERRAL SENT TO SCHEDULING AND NOTES ON FILE FROM [facility].

## 2018-05-29 ENCOUNTER — Telehealth: Payer: Self-pay | Admitting: *Deleted

## 2018-05-29 NOTE — Telephone Encounter (Signed)
[facility] [phone].

## 2018-05-30 ENCOUNTER — Encounter: Payer: Self-pay | Admitting: Cardiology

## 2018-05-30 DIAGNOSIS — K5900 Constipation, unspecified: Secondary | ICD-10-CM | POA: Insufficient documentation

## 2018-05-30 DIAGNOSIS — H905 Unspecified sensorineural hearing loss: Secondary | ICD-10-CM | POA: Insufficient documentation

## 2018-05-30 DIAGNOSIS — D899 Disorder involving the immune mechanism, unspecified: Secondary | ICD-10-CM

## 2018-05-30 DIAGNOSIS — N181 Chronic kidney disease, stage 1: Secondary | ICD-10-CM | POA: Insufficient documentation

## 2018-05-30 DIAGNOSIS — N059 Unspecified nephritic syndrome with unspecified morphologic changes: Secondary | ICD-10-CM | POA: Insufficient documentation

## 2018-05-30 DIAGNOSIS — D849 Immunodeficiency, unspecified: Secondary | ICD-10-CM | POA: Insufficient documentation

## 2018-05-30 DIAGNOSIS — M329 Systemic lupus erythematosus, unspecified: Secondary | ICD-10-CM | POA: Insufficient documentation

## 2018-05-30 DIAGNOSIS — N039 Chronic nephritic syndrome with unspecified morphologic changes: Secondary | ICD-10-CM | POA: Insufficient documentation

## 2018-05-30 DIAGNOSIS — K8020 Calculus of gallbladder without cholecystitis without obstruction: Secondary | ICD-10-CM | POA: Insufficient documentation

## 2018-05-30 DIAGNOSIS — R809 Proteinuria, unspecified: Secondary | ICD-10-CM | POA: Insufficient documentation

## 2018-05-30 DIAGNOSIS — E669 Obesity, unspecified: Secondary | ICD-10-CM | POA: Insufficient documentation

## 2018-05-30 DIAGNOSIS — M3214 Glomerular disease in systemic lupus erythematosus: Secondary | ICD-10-CM | POA: Insufficient documentation

## 2018-05-30 DIAGNOSIS — I10 Essential (primary) hypertension: Secondary | ICD-10-CM | POA: Insufficient documentation

## 2018-06-01 ENCOUNTER — Ambulatory Visit: Payer: Medicaid Other | Admitting: Cardiology

## 2018-06-08 ENCOUNTER — Ambulatory Visit: Payer: Medicaid Other | Admitting: Cardiology

## 2018-07-26 ENCOUNTER — Telehealth: Payer: Self-pay

## 2018-07-26 ENCOUNTER — Other Ambulatory Visit: Payer: Self-pay

## 2018-07-26 NOTE — Telephone Encounter (Signed)
   TELEPHONE CALL NOTE  Traci Ward has been deemed a candidate for a follow-up tele-health visit to limit community exposure during the Covid-19 pandemic. I spoke with the patient via phone to ensure availability of phone/video source, confirm preferred email & phone number, and discuss instructions and expectations.  I reminded Traci Ward to be prepared with any vital sign and/or heart rhythm information that could potentially be obtained via home monitoring, at the time of her visit. I reminded patient to expect a phone call during her scheduled time. Consent information sent via mychart.   Parke Poisson, RN 07/26/2018 1:58 PM

## 2018-07-27 ENCOUNTER — Telehealth (INDEPENDENT_AMBULATORY_CARE_PROVIDER_SITE_OTHER): Payer: Medicaid Other | Admitting: Cardiology

## 2018-07-27 DIAGNOSIS — Z7189 Other specified counseling: Secondary | ICD-10-CM

## 2018-07-27 DIAGNOSIS — I10 Essential (primary) hypertension: Secondary | ICD-10-CM | POA: Diagnosis not present

## 2018-07-27 DIAGNOSIS — M3219 Other organ or system involvement in systemic lupus erythematosus: Secondary | ICD-10-CM

## 2018-07-27 NOTE — Progress Notes (Signed)
Virtual Visit via Telephone Note    Evaluation Performed:  Follow-up visit  This visit type was conducted due to national recommendations for restrictions regarding the COVID-19 Pandemic (e.g. social distancing).  This format is felt to be most appropriate for this patient at this time.  All issues noted in this document were discussed and addressed.  No physical exam was performed (except for noted visual exam findings with Video Visits).  Please refer to the patient's chart (MyChart message for video visits and phone note for telephone visits) for the patient's consent to telehealth for [facility].  Date:  07/27/2018   ID:  Traci Ward, DOB 1995-08-02, MRN [id number]  Patient Location:  [address] [place] [address]   Provider location:   [place], [place] - [facility]  PCP:  Dr. Sharlee Blew (referred) Cardiologist:  Jodelle Red (new)  Chief Complaint:  Establish care, lupus complications including hypertension  History of Present Illness:    Traci Ward is a 23 y.o. female who presents via audio/video conferencing for a telehealth visit today.   The patient does not have symptoms concerning for COVID-19 infection (fever, chills, cough, or new SHORTNESS OF BREATH).   She has a history of systemic lupus erythematosus (anti-dsDNA +, anti-SSA +, anti-SSB +, anti-RNP +, anti-Smith +) with history of lupus nephritis, which has been followed by Dr. Valentina Lucks through [facility]. I reviewed received notes from 04/2017 and 04/2018 visits. She is a Consulting civil engineer at [employer] and was recommended to establish care with adult specialty providers.  Cardiovascular risk factors: Prior clinical ASCVD: none Comorbid conditions, including hypertension, hyperlipidemia, diabetes, chronic kidney disease:  Metabolic syndrome/Obesity: started at 270s, now in 240s. 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 Current diet: varies while in college, a lot of convenience/fast food.  Denies chest pain, shortness of breath at rest or with normal exertion. No PND, orthopnea, LE edema or unexpected weight gain. No syncope or palpitations.  Chest pain: long term, related to anxiety/stress, mild. Feels like a tightness/throbbing. Lasts only briefly, better with resting. Located in center of chest, no radiation. Occasional.   HTN: can get really high, especially at doctor's office. Has been 200/100 before. Goes to student health center, 130-140/80-90.   Prior CV studies:   The following studies were reviewed today: Notes from Dr. Sharlee Blew Per note in 04/2017, had a normal echo other than LVH, but I cannot see the report or the records. [facility] is connected in Care Everywhere, but no records found. Echo commented as being from 12/02/15 mild LVH, echo 04/05/17 moderate LVH.   Past Medical History:  Diagnosis Date  . Hypertension   . Kidney disease   . Lupus (HCC)    No past surgical history on file.   Current Meds  Medication Sig  . amLODipine (NORVASC) 10 MG tablet Take 10 mg by mouth 2 (two) times daily.  . cetirizine (ZYRTEC) 10 MG tablet Take 10 mg by mouth daily.  . Cholecalciferol (VITAMIN D3) 50 MCG (2000 UT) TABS Take 1 capsule by mouth daily.  . enalapril (VASOTEC) 5 MG tablet Take 5 mg by mouth 2 (two) times daily.  . hydrochlorothiazide (MICROZIDE) 12.5 MG capsule Take 12.5 mg by mouth daily.   . hydroxychloroquine (PLAQUENIL) 200 MG tablet Take 200 mg by mouth 2 (two)  times daily.  Marland Kitchen ibuprofen (ADVIL,MOTRIN) 200 MG tablet Take 400 mg by mouth every 6 (six) hours as needed for headache, mild pain or moderate pain.   . mycophenolate (CELLCEPT) 500 MG tablet Take 1,000 mg by mouth every 12 (twelve) hours.  Marland Kitchen  NORTREL 7/7/7 0.5/0.75/1-35 MG-MCG tablet Take 1 tablet by mouth daily.  . ondansetron (ZOFRAN) 8 MG tablet Take 8 mg by mouth every 6 (six) hours as needed for nausea or vomiting.  Marland Kitchen PROAIR HFA 108 (90 Base) MCG/ACT inhaler Inhale 2 puffs into the lungs as needed for wheezing or shortness of breath.   . ranitidine (ZANTAC) 150 MG capsule Take 150 mg by mouth 2 (two) times daily.  . Wheat Dextrin (BENEFIBER PO) Take 3.5 g by mouth as needed.      Allergies:   Patient has no known allergies.   Social History   Tobacco Use  . Smoking status: Never Smoker  . Smokeless tobacco: Never Used  Substance Use Topics  . Alcohol use: No  . Drug use: No     Family Hx: The patient's family history is not on file.  ROS:   Please see the history of present illness.    All other systems reviewed and are negative.   Labs/Other Tests and Data Reviewed:    Recent Labs: 12/11/2017: ALT 9; BUN 10; Creatinine, Ser 1.01; Hemoglobin 14.3; Platelets 533; Potassium 3.4; Sodium 136   Recent Lipid Panel No results found for: CHOL, TRIG, HDL, CHOLHDL, LDLCALC, LDLDIRECT  Wt Readings from Last 3 Encounters:  No data found for Wt     Exam:    Vital Signs:  Unable to obtain  ASSESSMENT & PLAN:    Lupus, with complication of hypertension, prior lupus nephritis -BP regimen is atypical but has resulted in control for her -she is pending establishing care with nephrologist -reported prior echos only notable for LVH -chest pain is long term, atypical, related to stress -spoke at length on signs/symptoms, stress/anxiety control -she plans to return home from [employer] for now, but will return to [facility] in the fall -will hold on changing regimen for hypertension today as she will not be available for follow up.  COVID-19 Education: The signs and symptoms of COVID-19 were discussed with the patient and how to seek care for testing (follow up with PCP or arrange E-visit).  The importance of social  distancing was discussed today.  Patient Risk:   After full review of this patients clinical status, I feel that they are at least moderate risk at this time.  Time:   Today, I have spent 22 minutes with the patient with telehealth technology discussing history, signs/symptoms, plans for management.     Medication Adjustments/Labs and Tests Ordered: Current medicines are reviewed at length with the patient today.  Concerns regarding medicines are outlined above.  Tests Ordered: No orders of the defined types were placed in this encounter.  Medication Changes: No orders of the defined types were placed in this encounter.   Disposition:  in 6 month(s)  Signed, Jodelle Red, MD  07/27/2018 12:26 PM    [facility]

## 2018-07-27 NOTE — Patient Instructions (Signed)
Medication Instructions:  Your Physician recommend you continue on your current medication as directed.    If you need a refill on your cardiac medications before your next appointment, please call your pharmacy.   Lab work: None  Testing/Procedures: None  Follow-Up: At [facility], you and your health needs are our priority.  As part of our continuing mission to provide you with exceptional heart care, we have created designated Provider Care Teams.  These Care Teams include your primary Cardiologist (physician) and Advanced Practice Providers (APPs -  Physician Assistants and Nurse Practitioners) who all work together to provide you with the care you need, when you need it. You will need a follow up appointment in 6 months.  Please call our office 2 months in advance to schedule this appointment.  You may see Dr. Christopher or one of the following Advanced Practice Providers on your designated Care Team:   Rhonda Barrett, PA-C . Kathryn Lawrence, DNP, ANP     

## 2019-04-30 IMAGING — CT CT RENAL STONE PROTOCOL
2 of 4 series · 16 of 46 positions shown, 18 images · non-contrast
Comparison: 05/30/2017

CLINICAL DATA: Hypertension, kidney disease, lupus, left flank pain

EXAM:
CT ABDOMEN AND PELVIS WITHOUT CONTRAST
TECHNIQUE: Multidetector CT imaging of the abdomen and pelvis was performed
following the standard protocol without IV contrast.

[Series 2: axial st · axial · 0.91mm/px · z∈[+1162,+1576]mm · 13 of 93 slices shown, 15 images]
[im 5/93  soft-tissue]
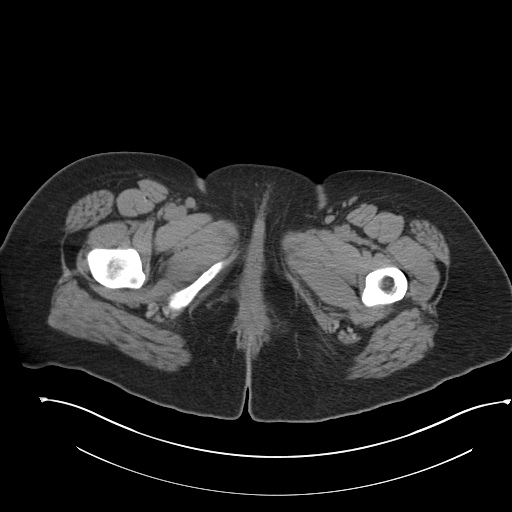
[im 5/93  bone]
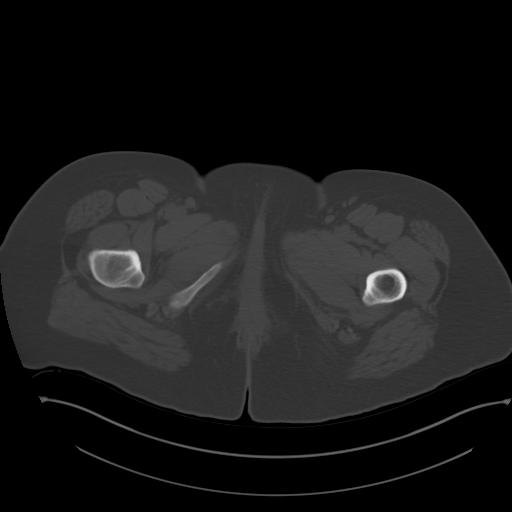
[im 14/93  soft-tissue]
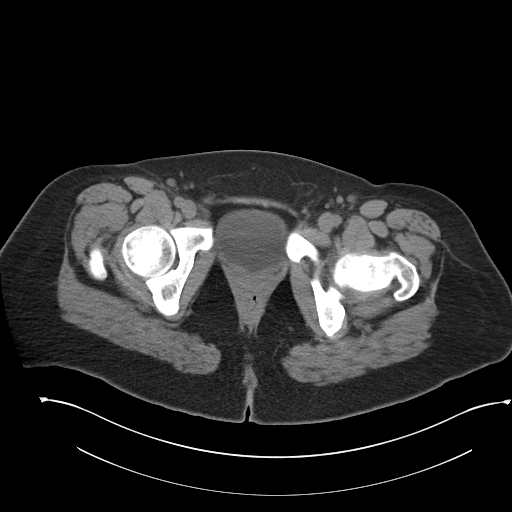
[im 19/93  soft-tissue]
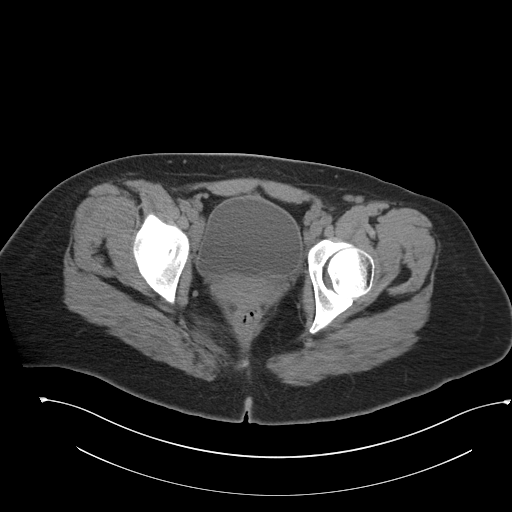
[im 28/93  soft-tissue]
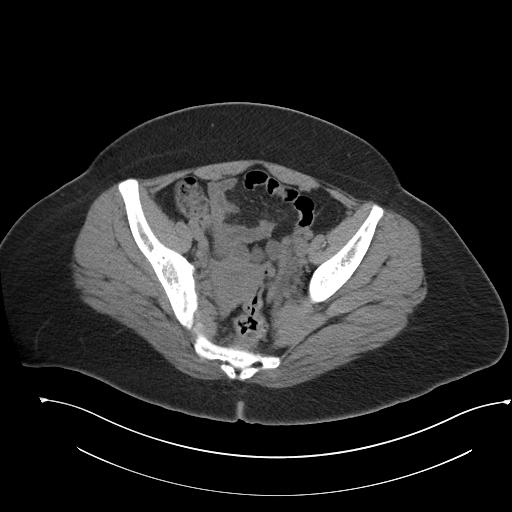
[im 33/93  soft-tissue]
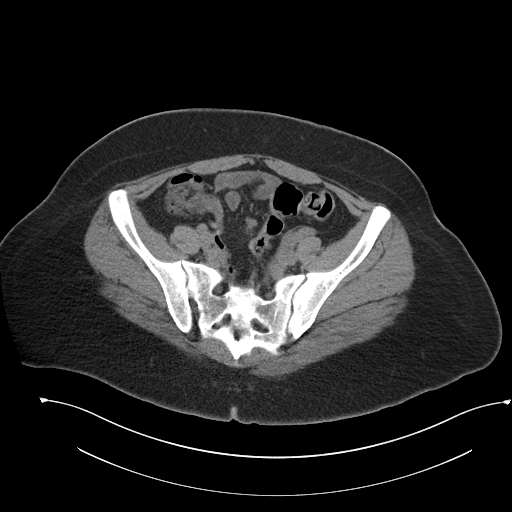
[im 42/93  soft-tissue]
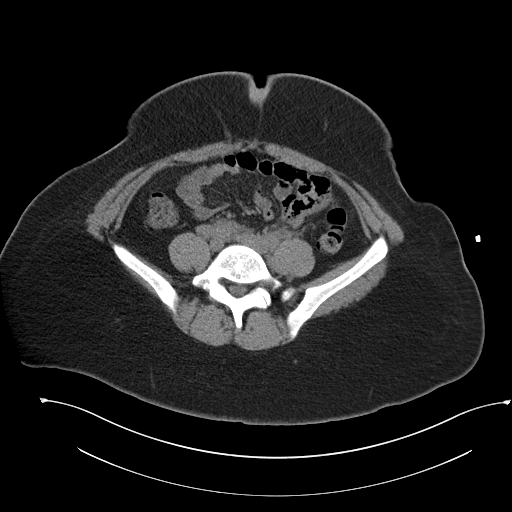
[im 47/93  soft-tissue]
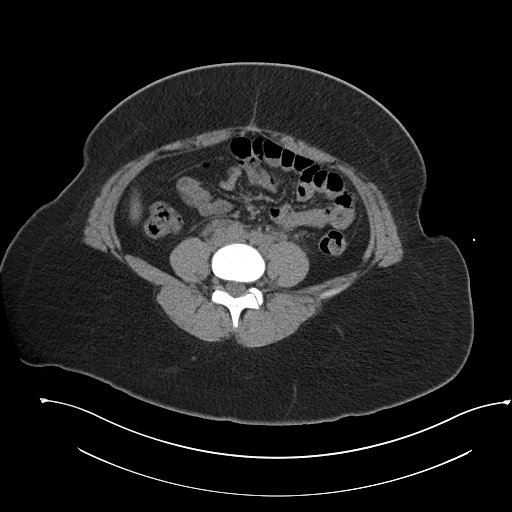
[im 51/93  soft-tissue]
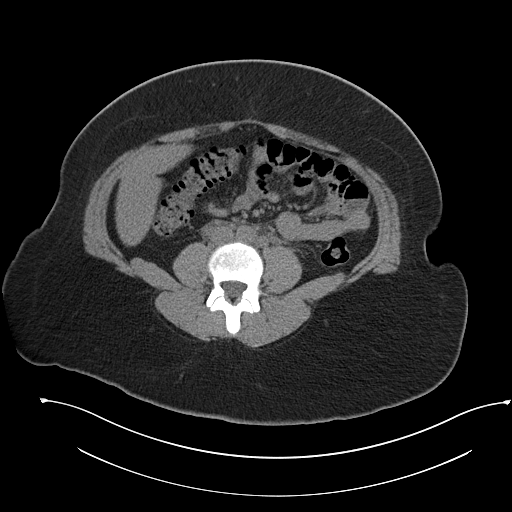
[im 60/93  soft-tissue]
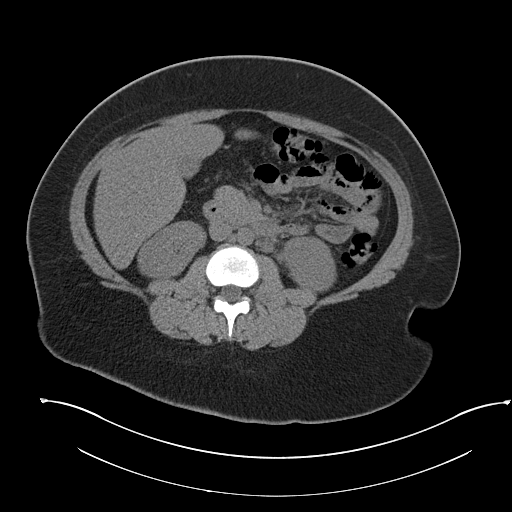
[im 60/93  bone]
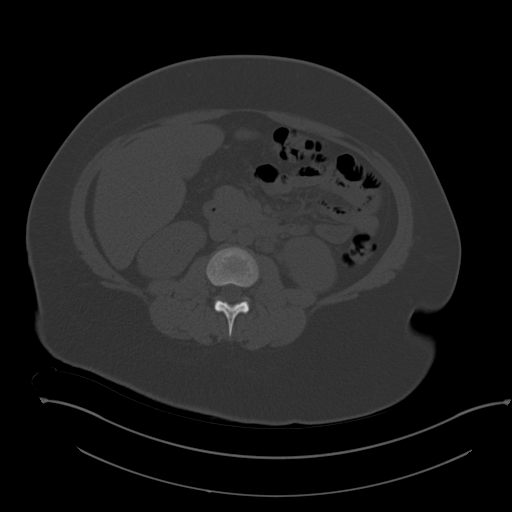
[im 65/93  soft-tissue]
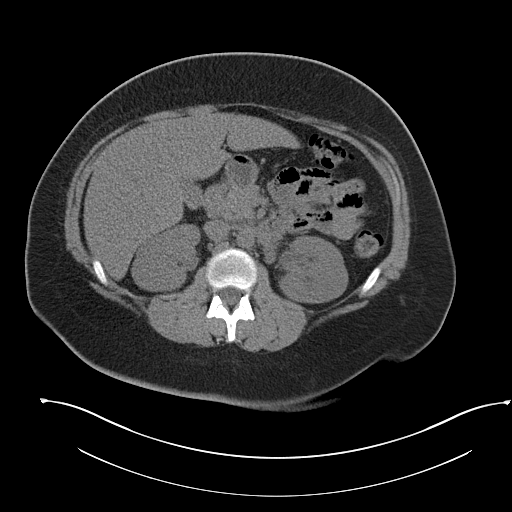
[im 74/93  soft-tissue]
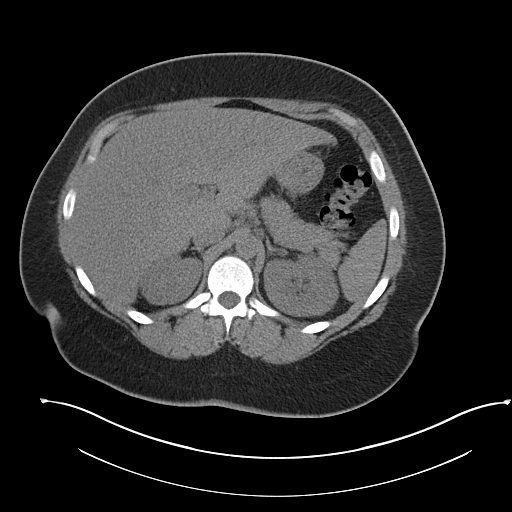
[im 79/93  soft-tissue]
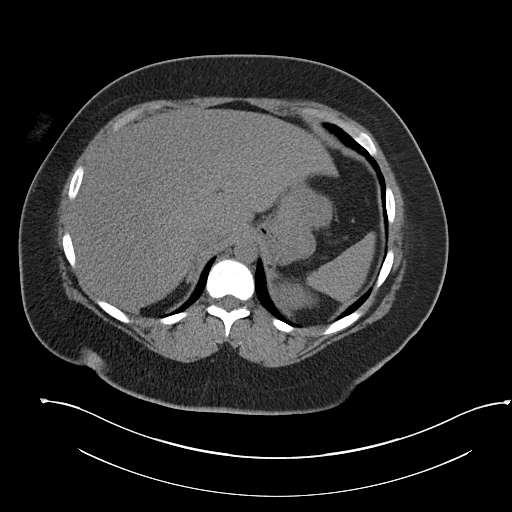
[im 88/93  soft-tissue]
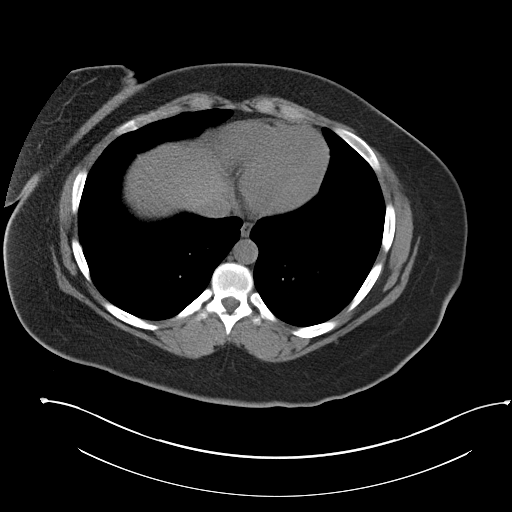

[Series 5: coronal · coronal · 0.78mm/px · 3 of 174 slices shown]
[im 58/174  soft-tissue]
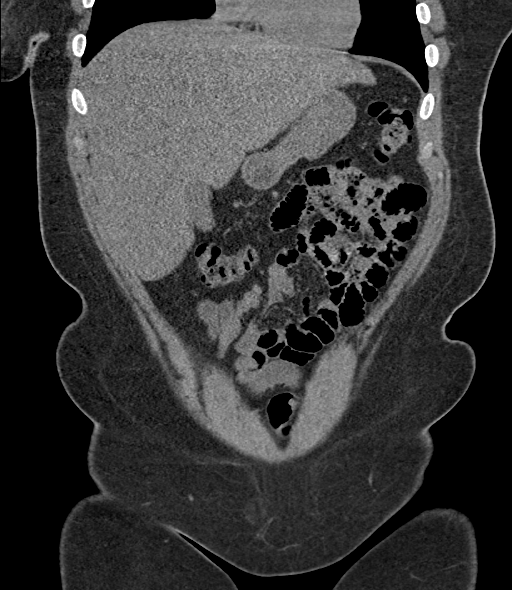
[im 77/174  soft-tissue]
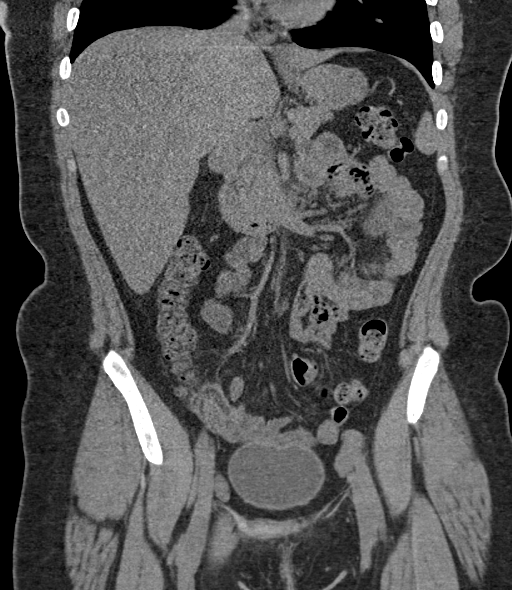
[im 97/174  soft-tissue]
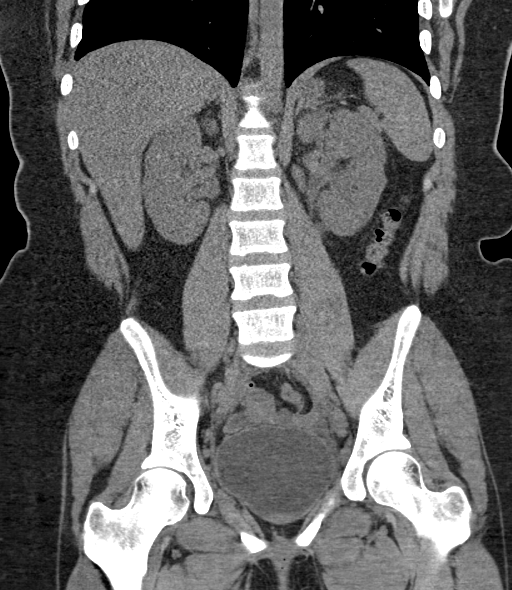

[16 of 46 positions shown; findings below may reference images not displayed]

FINDINGS: Lower chest: Chronic right basilar subpleural scarring. No acute
finding in the lower chest. Normal heart size. No pericardial
pleural effusion.

Hepatobiliary: Hepatomegaly noted, liver measures 20 cm in length.
No focal abnormality within the limits of noncontrast imaging. No
biliary obstruction or dilatation. Gallbladder and common bile duct
unremarkable.

Pancreas: Unremarkable. No pancreatic ductal dilatation or
surrounding inflammatory changes.

Spleen: Normal in size without focal abnormality.

Adrenals/Urinary Tract: Normal adrenal glands. Mild left
pelviectasis and hydroureter. Minor bilateral peri ureteral strandy
edema. No obstructing radiopaque calculus. Appearance is compatible
with ascending urinary tract infection or pyelonephritis. Bladder
unremarkable. No definite UVJ abnormality.

Stomach/Bowel: Stomach is within normal limits. Appendix appears
normal. No evidence of bowel wall thickening, distention, or
inflammatory changes.

Vascular/Lymphatic: No aneurysm. Prominent retroperitoneal lymph
nodes as before. No definite adenopathy. Mild prominence of the
external iliac and inguinal lymph nodes bilaterally, nonspecific but
suspect reactive.

Reproductive: Uterus and adnexa normal in size. Trace pelvic free
fluid likely physiologic.

Other: No abdominal wall hernia or abnormality. No abdominopelvic
ascites.

Musculoskeletal: No acute or significant osseous findings.
IMPRESSION: Mild left pelviectasis and left hydroureter with associated
bilateral periureteral stranding edema, suspect ascending urinary
tract infection or pyelonephritis. No obstructing radiopaque urinary
tract or ureteral calculus. Appearance similar to 05/30/2017 CT
exam.

## 2019-09-06 IMAGING — DX DG CHEST 2V
2 series · 2 of 2 positions shown · non-contrast
Comparison: None.

CLINICAL DATA: Epigastric pain

EXAM:
CHEST - 2 VIEW

[chest pa]
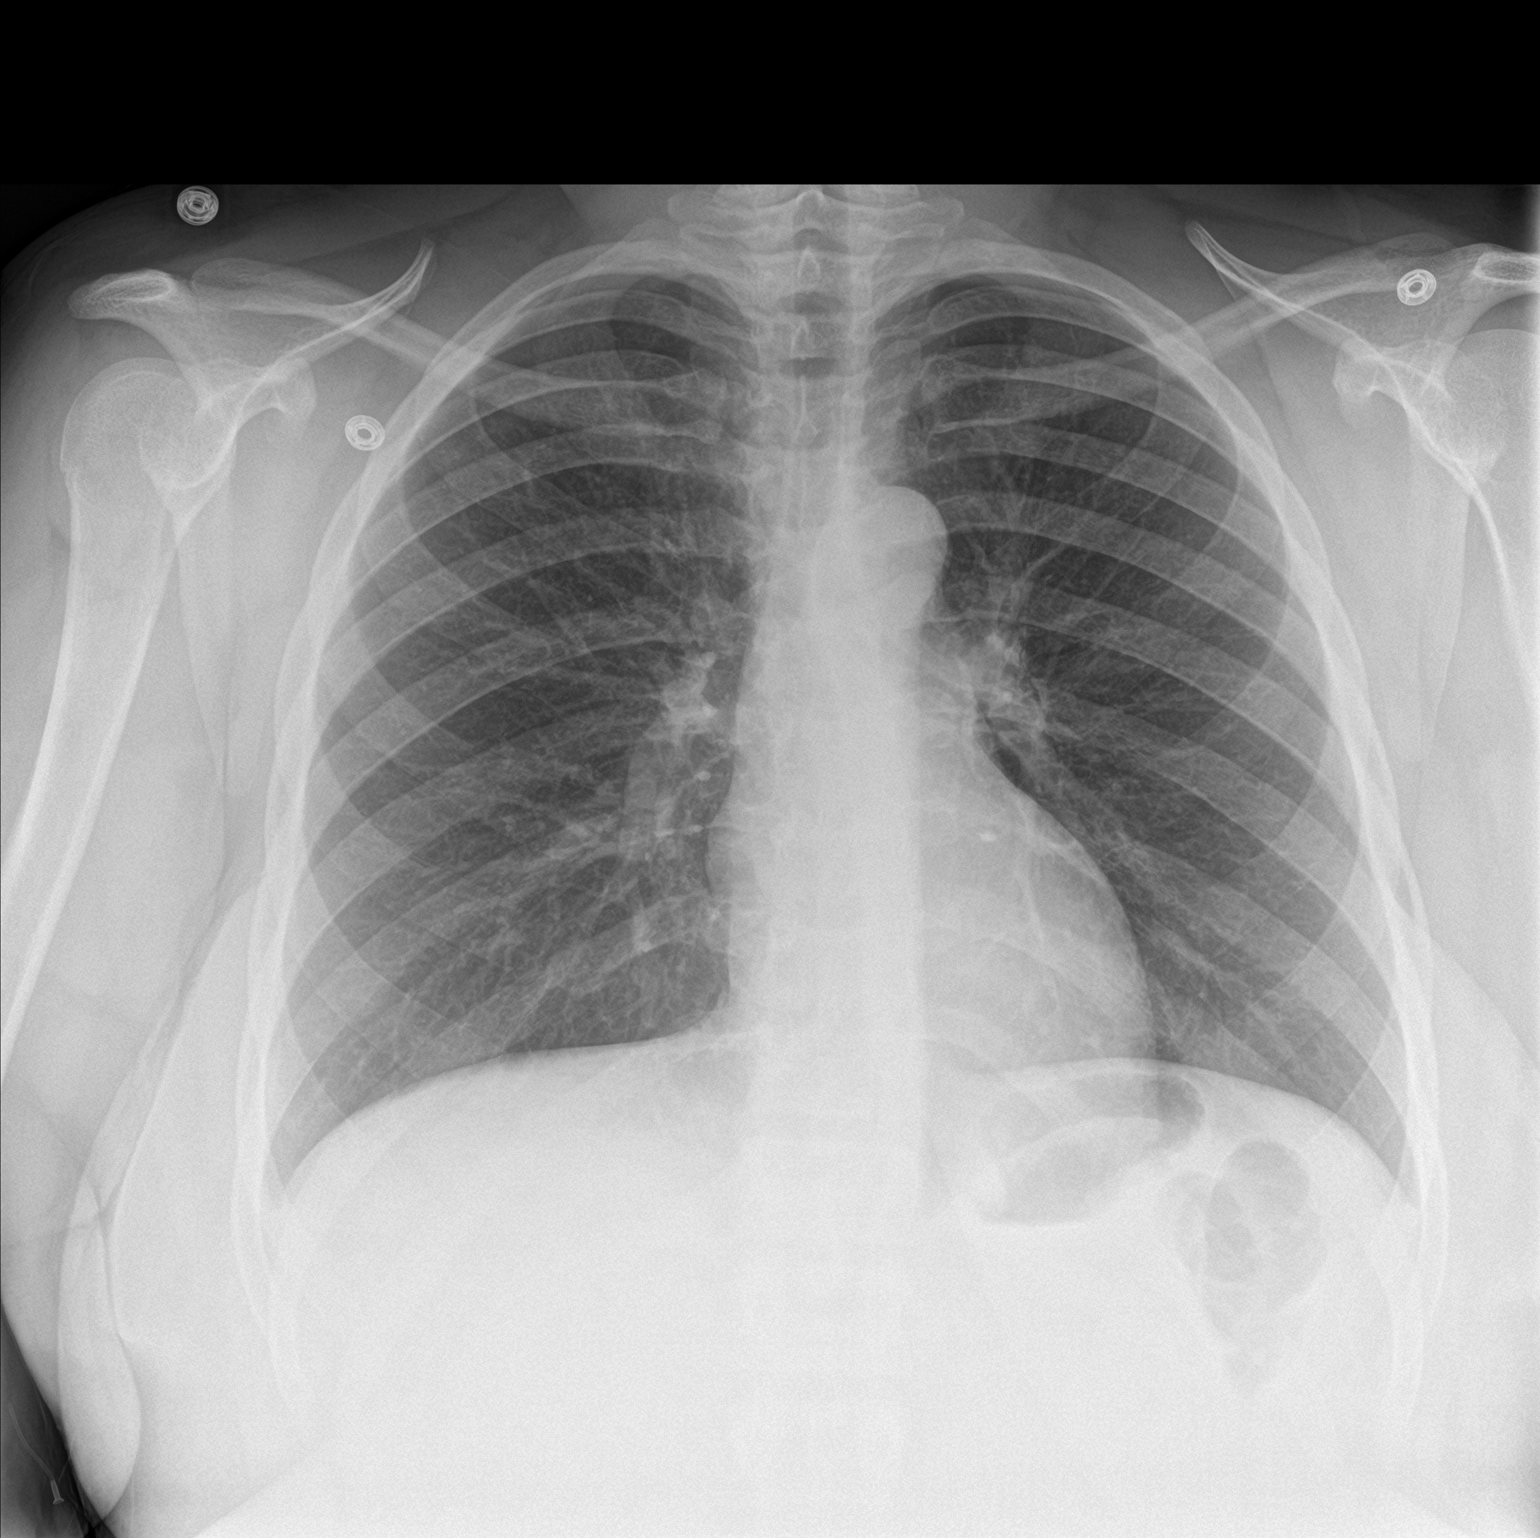

[chest lat]
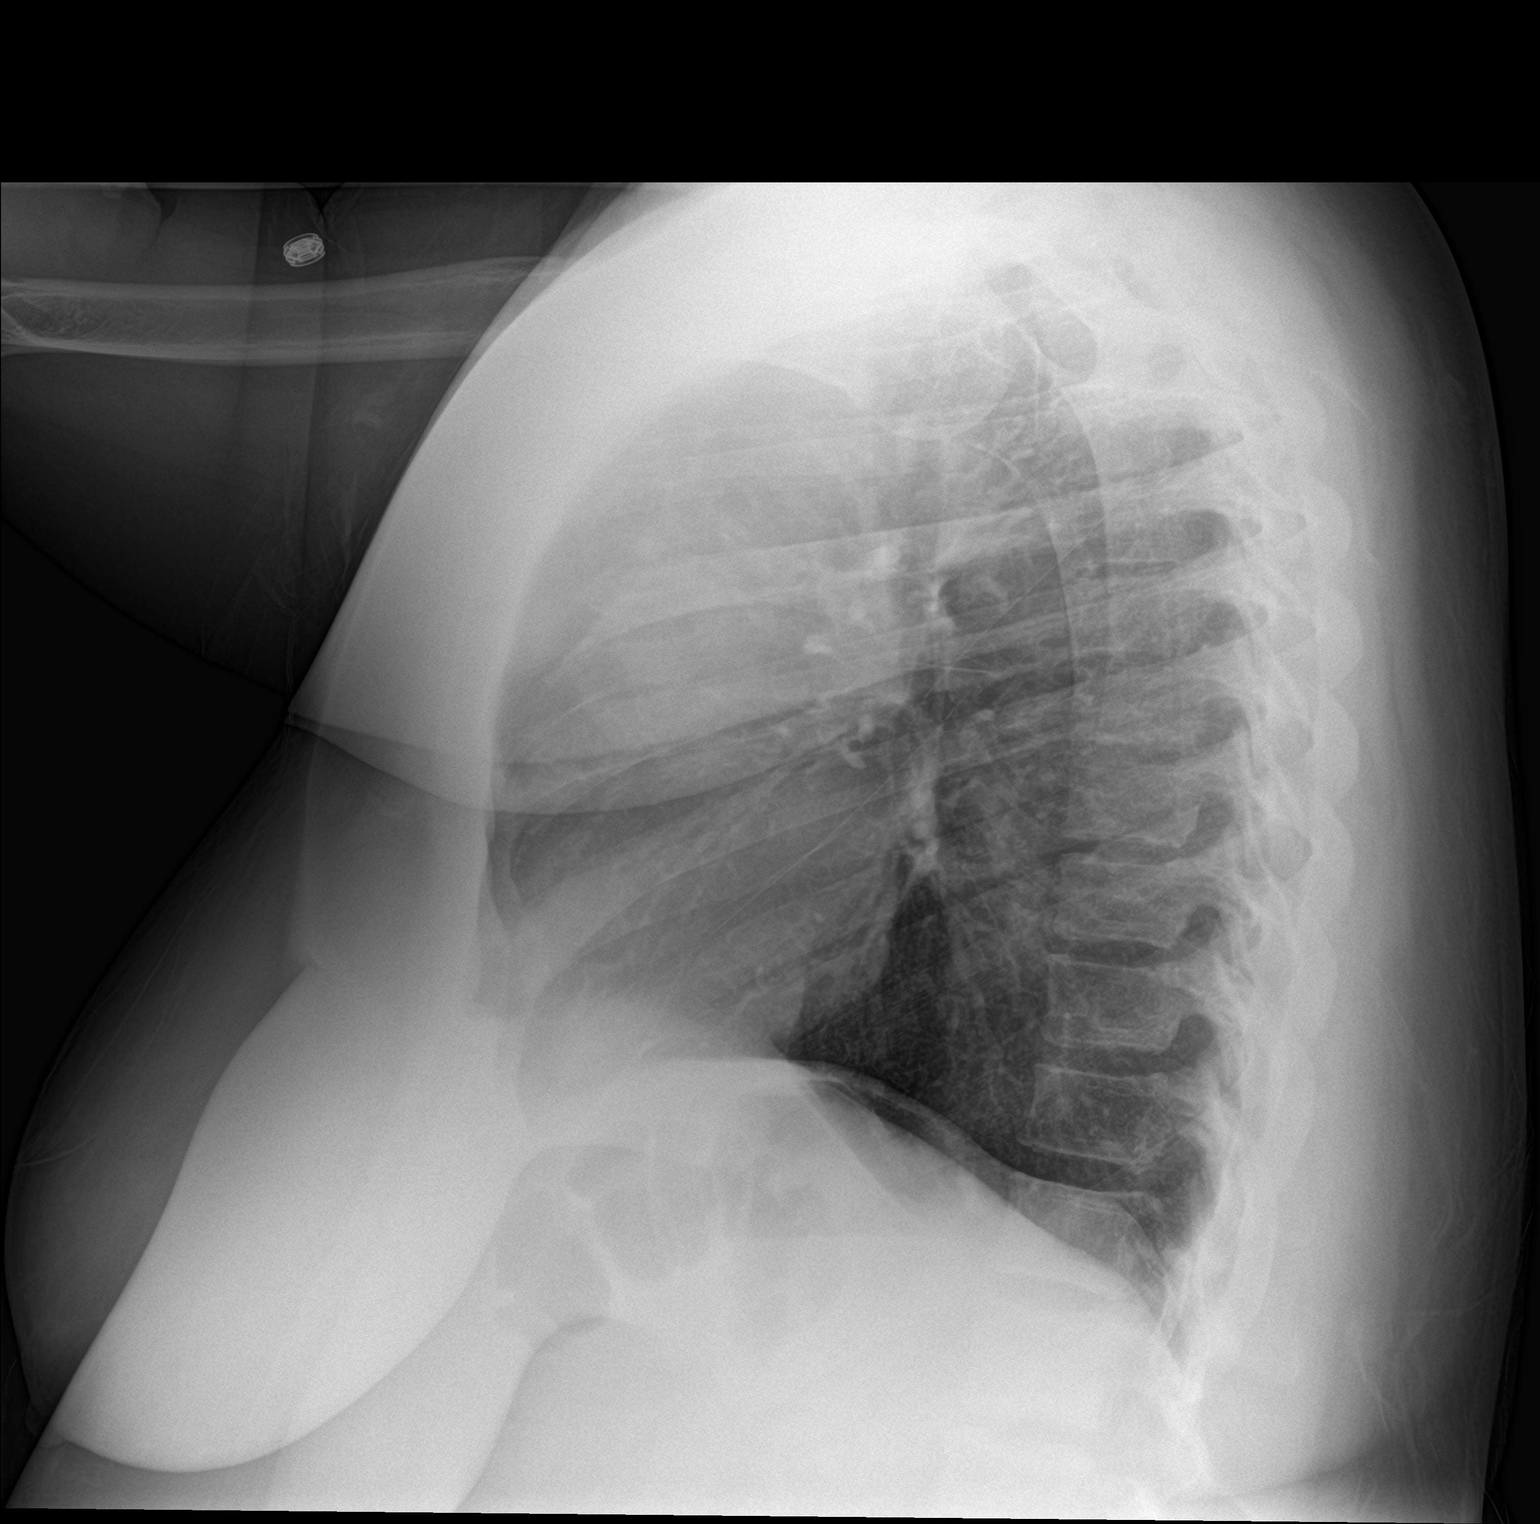

[2 of 2 positions shown; findings below may reference images not displayed]

FINDINGS: The heart size and mediastinal contours are within normal limits.
Both lungs are clear. The visualized skeletal structures are
unremarkable.
IMPRESSION: No active cardiopulmonary disease.

## 2020-02-21 ENCOUNTER — Encounter: Payer: Self-pay | Admitting: Cardiology

## 2020-12-31 DEATH — deceased
